# Patient Record
Sex: Male | Born: 1975 | Race: White | Hispanic: No | Marital: Married | Smoking: Never smoker
Health system: Southern US, Community
[De-identification: ages and names within clinical notes are randomized; demographics above are authoritative.]

## PROBLEM LIST (undated history)

## (undated) DIAGNOSIS — Z789 Other specified health status: Secondary | ICD-10-CM

---

## 2013-11-04 ENCOUNTER — Emergency Department (INDEPENDENT_AMBULATORY_CARE_PROVIDER_SITE_OTHER)
Admission: EM | Admit: 2013-11-04 | Discharge: 2013-11-04 | Disposition: A | Discharge status: Home / Self Care | Payer: TRICARE For Life (TFL) | Source: Home / Self Care | Attending: Emergency Medicine | Admitting: Emergency Medicine

## 2013-11-04 ENCOUNTER — Encounter (HOSPITAL_COMMUNITY): Payer: Self-pay | Admitting: Emergency Medicine

## 2013-11-04 DIAGNOSIS — L2089 Other atopic dermatitis: Secondary | ICD-10-CM

## 2013-11-04 DIAGNOSIS — L209 Atopic dermatitis, unspecified: Secondary | ICD-10-CM

## 2013-11-04 MED ORDER — CLOTRIMAZOLE-BETAMETHASONE 1-0.05 % EX CREA: 1.0000 "application " | TOPICAL_CREAM | Freq: Two times a day (BID) | CUTANEOUS | Status: DC

## 2013-11-04 NOTE — ED Provider Notes (Signed)
Medical screening examination/treatment/procedure(s) were performed by non-physician practitioner and as supervising physician I was immediately available for consultation/collaboration.  Leslee Home, M.D.   Reuben Likes, MD 11/04/13 [phone]

## 2013-11-04 NOTE — Discharge Instructions (Signed)
Eczema Eczema, also called atopic dermatitis, is a skin disorder that causes inflammation of the skin. It causes a red rash and dry, scaly skin. The skin becomes very itchy. Eczema is generally worse during the cooler winter months and often improves with the warmth of summer. Eczema usually starts showing signs in infancy. Some children outgrow eczema, but it may last through adulthood.  CAUSES  The exact cause of eczema is not known, but it appears to run in families. People with eczema often have a family history of eczema, allergies, asthma, or hay fever. Eczema is not contagious. Flare-ups of the condition may be caused by:   Contact with something you are sensitive or allergic to.   Stress. SIGNS AND SYMPTOMS  Dry, scaly skin.   Red, itchy rash.   Itchiness. This may occur before the skin rash and may be very intense.  DIAGNOSIS  The diagnosis of eczema is usually made based on symptoms and medical history. TREATMENT  Eczema cannot be cured, but symptoms usually can be controlled with treatment and other strategies. A treatment plan might include:  Controlling the itching and scratching.   Use over-the-counter antihistamines as directed for itching. This is especially useful at night when the itching tends to be worse.   Use over-the-counter steroid creams as directed for itching.   Avoid scratching. Scratching makes the rash and itching worse. It may also result in a skin infection (impetigo) due to a break in the skin caused by scratching.   Keeping the skin well moisturized with creams every day. This will seal in moisture and help prevent dryness. Lotions that contain alcohol and water should be avoided because they can dry the skin.   Limiting exposure to things that you are sensitive or allergic to (allergens).   Recognizing situations that cause stress.   Developing a plan to manage stress.  HOME CARE INSTRUCTIONS   Only take over-the-counter or  prescription medicines as directed by your health care provider.   Do not use anything on the skin without checking with your health care provider.   Keep baths or showers short (5 minutes) in warm (not hot) water. Use mild cleansers for bathing. These should be unscented. You may add nonperfumed bath oil to the bath water. It is best to avoid soap and bubble bath.   Immediately after a bath or shower, when the skin is still damp, apply a moisturizing ointment to the entire body. This ointment should be a petroleum ointment. This will seal in moisture and help prevent dryness. The thicker the ointment, the better. These should be unscented.   Keep fingernails cut short. Children with eczema may need to wear soft gloves or mittens at night after applying an ointment.   Dress in clothes made of cotton or cotton blends. Dress lightly, because heat increases itching.   A child with eczema should stay away from anyone with fever blisters or cold sores. The virus that causes fever blisters (herpes simplex) can cause a serious skin infection in children with eczema. SEEK MEDICAL CARE IF:   Your itching interferes with sleep.   Your rash gets worse or is not better within 1 week after starting treatment.   You see pus or soft yellow scabs in the rash area.   You have a fever.   You have a rash flare-up after contact with someone who has fever blisters.  Document Released: 01/24/2000 Document Revised: 11/16/2012 Document Reviewed: 08/29/2012 Exit[employer] Patient Information 2015 [employer]. This information   is not intended to replace advice given to you by your health care provider. Make sure you discuss any questions you have with your health care provider.  

## 2013-11-04 NOTE — ED Notes (Signed)
C/o  Rash on arms x 2 wks.  No otc treatments tried.   Denies new medication or changes in soaps/detergents.

## 2013-11-04 NOTE — ED Provider Notes (Signed)
CSN: [id number]     Arrival date & time 11/04/13  1724 History   First MD Initiated Contact with Patient 11/04/13 1801     Chief Complaint  Patient presents with  . Rash   (Consider location/radiation/quality/duration/timing/severity/associated sxs/prior Treatment) HPI Comments: Reports himself to be otherwise healthy. 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   Patient is a 38 y.o. male presenting with rash. The history is provided by the patient.  Rash Location:  Shoulder/arm Shoulder/arm rash location:  L upper arm, L forearm, R upper arm and R forearm Quality: dryness, itchiness and redness   Severity:  Moderate Onset quality:  Gradual Duration:  2 weeks Timing:  Constant Progression:  Worsening Chronicity:  New Context: not chemical exposure, not exposure to similar rash, not food, not hot tub use, not insect bite/sting, not medications, not new detergent/soap, not plant contact, not sick contacts and not sun exposure   Ineffective treatments:  Anti-itch cream and topical steroids   History reviewed. No pertinent past medical history. History reviewed. No pertinent past surgical history. History reviewed. No pertinent family history. History  Substance Use Topics  . Smoking status: Never Smoker   . Smokeless tobacco: Not on file  . Alcohol Use: Yes    Review of Systems  Skin: Positive for rash.  All other systems reviewed and are negative.   Allergies  Review of patient's allergies indicates no known allergies.  Home Medications   Prior to Admission medications   Medication Sig Start Date End Date Taking? Authorizing Provider  clotrimazole-betamethasone (LOTRISONE) cream Apply 1 application topically 2 (two) times daily. X 10-14 days 11/04/13   Jess Barters H Deztinee Lohmeyer, PA   BP 145/86  Pulse 65  Temp(Src) 97.9 F (36.6 C) (Oral)  SpO2 100% Physical Exam  Nursing note and vitals  reviewed. Constitutional: He is oriented to person, place, and time. He appears well-developed and well-nourished. No distress.  HENT:  Head: Normocephalic and atraumatic.  Eyes: Conjunctivae are normal.  Cardiovascular: Normal rate.   Pulmonary/Chest: Effort normal.  Musculoskeletal: Normal range of motion.  Neurological: He is alert and oriented to person, place, and time.  Skin: Skin is warm and dry. Rash noted. There is erythema.     Outlined area is with erythematous maculopapular confluent dry rash with fine superficial scale   Psychiatric: He has a normal mood and affect. His behavior is normal.    ED Course  Procedures (including critical care time) Labs Review Labs Reviewed - No data to display  Imaging Review No results found.   MDM   1. Atopic dermatitis    Non-specific atopic dermatitis. Will treat with 10-14 days of Lotrisone and advise follow up if no improvement.    Ria Clock, [place] 11/04/13 1843

## 2014-06-05 ENCOUNTER — Encounter (HOSPITAL_COMMUNITY): Payer: Self-pay | Admitting: *Deleted

## 2014-06-05 ENCOUNTER — Emergency Department (HOSPITAL_COMMUNITY)
Admission: EM | Admit: 2014-06-05 | Discharge: 2014-06-05 | Disposition: A | Discharge status: Home / Self Care | Payer: TRICARE For Life (TFL) | Attending: Emergency Medicine | Admitting: Emergency Medicine

## 2014-06-05 ENCOUNTER — Emergency Department (HOSPITAL_COMMUNITY): Payer: TRICARE For Life (TFL)

## 2014-06-05 DIAGNOSIS — M545 Low back pain: Secondary | ICD-10-CM | POA: Diagnosis present

## 2014-06-05 DIAGNOSIS — M62830 Muscle spasm of back: Secondary | ICD-10-CM | POA: Diagnosis not present

## 2014-06-05 MED ORDER — MELOXICAM 15 MG PO TABS: 15.0000 mg | ORAL_TABLET | Freq: Every day | ORAL | Status: DC

## 2014-06-05 MED ORDER — DIAZEPAM 5 MG PO TABS
5.0000 mg | ORAL_TABLET | Freq: Once | ORAL | Status: AC
  Administered 2014-06-05: 5 mg via ORAL
  Filled 2014-06-05: qty 1

## 2014-06-05 MED ORDER — CYCLOBENZAPRINE HCL 10 MG PO TABS: 10.0000 mg | ORAL_TABLET | Freq: Two times a day (BID) | ORAL | Status: DC | PRN

## 2014-06-05 MED ORDER — HYDROMORPHONE HCL 1 MG/ML IJ SOLN
2.0000 mg | Freq: Once | INTRAMUSCULAR | Status: AC
  Administered 2014-06-05: 2 mg via INTRAMUSCULAR
  Filled 2014-06-05: qty 2

## 2014-06-05 MED ORDER — OXYCODONE-ACETAMINOPHEN 5-325 MG PO TABS: 2.0000 | ORAL_TABLET | ORAL | Status: DC | PRN

## 2014-06-05 MED ORDER — KETOROLAC TROMETHAMINE 60 MG/2ML IM SOLN
60.0000 mg | Freq: Once | INTRAMUSCULAR | Status: AC
  Administered 2014-06-05: 60 mg via INTRAMUSCULAR
  Filled 2014-06-05: qty 2

## 2014-06-05 NOTE — ED Provider Notes (Signed)
CSN: [id number]     Arrival date & time 06/05/14  0709 History   First MD Initiated Contact with Patient 06/05/14 0730     Chief Complaint  Patient presents with  . Back Pain     (Consider location/radiation/quality/duration/timing/severity/associated sxs/prior Treatment) HPI Comments: Patient is a 39 year old male who presents with low back pain that started yesterday after heavy lifting. The pain is aching and severe and does not radiate. The pain is constant. Movement makes the pain worse. Nothing makes the pain better. Patient has not tried anything for pain. No associated symptoms. No saddle paresthesias or bladder/bowel incontinence.      History reviewed. No pertinent past medical history. History reviewed. No pertinent past surgical history. No family history on file. History  Substance Use Topics  . Smoking status: Never Smoker   . Smokeless tobacco: Not on file  . Alcohol Use: Yes    Review of Systems  Musculoskeletal: Positive for back pain.  All other systems reviewed and are negative.     Allergies  Review of patient's allergies indicates no known allergies.  Home Medications   Prior to Admission medications   Medication Sig Start Date End Date Taking? Authorizing Provider  clotrimazole-betamethasone (LOTRISONE) cream Apply 1 application topically 2 (two) times daily. X 10-14 days Patient not taking: Reported on 06/05/2014 11/04/13   Jess BartersJennifer Lee H Presson, PA   BP 126/92 mmHg  Pulse 66  Temp(Src) 97.6 F (36.4 C) (Oral)  Resp 18  Ht 5\' 9"  (1.753 m)  Wt 232 lb (105.235 kg)  BMI 34.24 kg/m2  SpO2 95% Physical Exam  Constitutional: He is oriented to person, place, and time. He appears well-developed and well-nourished. No distress.  HENT:  Head: Normocephalic and atraumatic.  Eyes: Conjunctivae and EOM are normal.  Neck: Normal range of motion.  Cardiovascular: Normal rate and regular rhythm.  Exam reveals no gallop and no friction rub.   No murmur  heard. Pulmonary/Chest: Effort normal and breath sounds normal. He has no wheezes. He has no rales. He exhibits no tenderness.  Abdominal: Soft. He exhibits no distension. There is no tenderness. There is no rebound.  Musculoskeletal: Normal range of motion.  No midline spine tenderness to palpation.   Neurological: He is alert and oriented to person, place, and time. Coordination normal.  Lower extremity strength and sensation equal and intact bilaterally. Speech is goal-oriented. Moves limbs without ataxia.   Skin: Skin is warm and dry.  Psychiatric: He has a normal mood and affect. His behavior is normal.  Nursing note and vitals reviewed.   ED Course  Procedures (including critical care time) Labs Review Labs Reviewed - No data to display  Imaging Review Dg Lumbar Spine Complete  06/05/2014   CLINICAL DATA:  Lifting boxes, twisting injury and left low back pain.  EXAM: LUMBAR SPINE - COMPLETE 4+ VIEW  COMPARISON:  None.  FINDINGS: There is no evidence of lumbar spine fracture. Alignment is normal. Intervertebral disc spaces are maintained.  IMPRESSION: Negative.   Electronically Signed   By: Charlett NoseKevin  Dover M.D.   On: 06/05/2014 09:20     EKG Interpretation None      MDM   Final diagnoses:  Spasm of lumbar paraspinous muscle    8:48 AM Patient given toradol and valium for symptoms which provided little relief. No bladder/bowel incontinence or saddle paresthesias. Patient will have IM dilaudid and lumbar spine xray.   10:16 AM Patient feeling better after dilaudid. Patient likely has muscle  spasm and will be discharged with percocet, mobic and flexeril. Vitals stable and patient afebrile.   Emilia Beck, PA-C 06/07/14 0600  Samuel Jester, DO 06/07/14 1454

## 2014-06-05 NOTE — ED Notes (Signed)
Patient wife came to desk to ask for something else for pain.  Per wife, patient pain has gotten worse and now going into legs.   Advised I would have PA come in to see patient.

## 2014-06-05 NOTE — Discharge Instructions (Signed)
Take Percocet as needed for pain. Take mobic for inflammation. Take Flexeril as needed for muscle spasm. You may take these medications together. Apply heat to the affected area. Refer to attached documents for more information.

## 2014-06-05 NOTE — ED Notes (Signed)
Pt states that he was lifting boxes when he twisted and began having pain in his left side. Pt states that he has pain with lifting and standing for lon periods. Pt denies numbness tingling to leg. No urinary symptoms reported.

## 2019-02-21 ENCOUNTER — Ambulatory Visit: Attending: Internal Medicine

## 2019-02-21 DIAGNOSIS — Z20822 Contact with and (suspected) exposure to covid-19: Secondary | ICD-10-CM

## 2019-02-23 LAB — NOVEL CORONAVIRUS, NAA: SARS-CoV-2, NAA: NOT DETECTED

## 2020-09-15 ENCOUNTER — Emergency Department

## 2020-09-15 ENCOUNTER — Observation Stay: Admitting: Anesthesiology

## 2020-09-15 ENCOUNTER — Observation Stay
Admission: EM | Admit: 2020-09-15 | Discharge: 2020-09-16 | Disposition: A | Discharge status: Home / Self Care | Attending: General Surgery | Admitting: General Surgery

## 2020-09-15 ENCOUNTER — Other Ambulatory Visit: Payer: Self-pay

## 2020-09-15 ENCOUNTER — Encounter
Admission: EM | Disposition: A | Discharge status: Home / Self Care | Payer: Self-pay | Source: Home / Self Care | Attending: Emergency Medicine

## 2020-09-15 DIAGNOSIS — R1011 Right upper quadrant pain: Secondary | ICD-10-CM | POA: Diagnosis present

## 2020-09-15 DIAGNOSIS — Z20822 Contact with and (suspected) exposure to covid-19: Secondary | ICD-10-CM | POA: Insufficient documentation

## 2020-09-15 DIAGNOSIS — K810 Acute cholecystitis: Secondary | ICD-10-CM | POA: Diagnosis present

## 2020-09-15 DIAGNOSIS — K8020 Calculus of gallbladder without cholecystitis without obstruction: Secondary | ICD-10-CM

## 2020-09-15 DIAGNOSIS — R109 Unspecified abdominal pain: Secondary | ICD-10-CM

## 2020-09-15 DIAGNOSIS — K8012 Calculus of gallbladder with acute and chronic cholecystitis without obstruction: Principal | ICD-10-CM | POA: Insufficient documentation

## 2020-09-15 DIAGNOSIS — K8050 Calculus of bile duct without cholangitis or cholecystitis without obstruction: Secondary | ICD-10-CM

## 2020-09-15 HISTORY — DX: Other specified health status: Z78.9

## 2020-09-15 LAB — CBC
HCT: 44.9 % (ref 39.0–52.0)
Hemoglobin: 16.1 g/dL (ref 13.0–17.0)
MCH: 30.6 pg (ref 26.0–34.0)
MCHC: 35.9 g/dL (ref 30.0–36.0)
MCV: 85.2 fL (ref 80.0–100.0)
Platelets: 269 10*3/uL (ref 150–400)
RBC: 5.27 MIL/uL (ref 4.22–5.81)
RDW: 12.9 % (ref 11.5–15.5)
WBC: 13.9 10*3/uL — ABNORMAL HIGH (ref 4.0–10.5)
nRBC: 0 % (ref 0.0–0.2)

## 2020-09-15 LAB — LIPASE, BLOOD: Lipase: 40 U/L (ref 11–51)

## 2020-09-15 LAB — BASIC METABOLIC PANEL
Anion gap: 9 (ref 5–15)
BUN: 12 mg/dL (ref 6–20)
CO2: 26 mmol/L (ref 22–32)
Calcium: 9.8 mg/dL (ref 8.9–10.3)
Chloride: 101 mmol/L (ref 98–111)
Creatinine, Ser: 1.06 mg/dL (ref 0.61–1.24)
GFR, Estimated: >60 mL/min (ref >60)
Glucose, Bld: 142 mg/dL — ABNORMAL HIGH (ref 70–99)
Potassium: 3.9 mmol/L (ref 3.5–5.1)
Sodium: 136 mmol/L (ref 135–145)

## 2020-09-15 LAB — RESP PANEL BY RT-PCR (FLU A&B, COVID) ARPGX2
Influenza A by PCR: NEGATIVE
Influenza B by PCR: NEGATIVE
SARS Coronavirus 2 by RT PCR: NEGATIVE

## 2020-09-15 LAB — TROPONIN I (HIGH SENSITIVITY)
Troponin I (High Sensitivity): 5 ng/L (ref <18)
Troponin I (High Sensitivity): 5 ng/L (ref <18)

## 2020-09-15 LAB — HEPATIC FUNCTION PANEL
ALT: 47 U/L — ABNORMAL HIGH (ref 0–44)
AST: 44 U/L — ABNORMAL HIGH (ref 15–41)
Albumin: 4.8 g/dL (ref 3.5–5.0)
Alkaline Phosphatase: 86 U/L (ref 38–126)
Bilirubin, Direct: <0.1 mg/dL (ref 0.0–0.2)
Total Bilirubin: 0.6 mg/dL (ref 0.3–1.2)
Total Protein: 7.7 g/dL (ref 6.5–8.1)

## 2020-09-15 SURGERY — CHOLECYSTECTOMY, ROBOT-ASSISTED, LAPAROSCOPIC
Anesthesia: General

## 2020-09-15 MED ORDER — MORPHINE SULFATE (PF) 4 MG/ML IV SOLN
4.0000 mg | Freq: Once | INTRAVENOUS | Status: AC
  Administered 2020-09-15: 4 mg via INTRAVENOUS
  Filled 2020-09-15: qty 1

## 2020-09-15 MED ORDER — PROPOFOL 10 MG/ML IV BOLUS
INTRAVENOUS | Status: AC
  Filled 2020-09-15: qty 20

## 2020-09-15 MED ORDER — ACETAMINOPHEN 650 MG RE SUPP: 650.0000 mg | Freq: Four times a day (QID) | RECTAL | Status: DC | PRN

## 2020-09-15 MED ORDER — "VISTASEAL 4 ML SINGLE DOSE KIT "
PACK | CUTANEOUS | Status: DC | PRN
  Administered 2020-09-15: 4 mL via TOPICAL

## 2020-09-15 MED ORDER — FENTANYL CITRATE (PF) 100 MCG/2ML IJ SOLN
INTRAMUSCULAR | Status: AC
  Filled 2020-09-15: qty 2

## 2020-09-15 MED ORDER — CEFAZOLIN (ANCEF) 1 G IV SOLR: 1.0000 g | INTRAVENOUS | Status: DC

## 2020-09-15 MED ORDER — 0.9 % SODIUM CHLORIDE (POUR BTL) OPTIME
TOPICAL | Status: DC | PRN
  Administered 2020-09-15: 500 mL

## 2020-09-15 MED ORDER — ENOXAPARIN SODIUM 60 MG/0.6ML IJ SOSY
0.5000 mg/kg | PREFILLED_SYRINGE | INTRAMUSCULAR | Status: DC
  Administered 2020-09-16: 50 mg via SUBCUTANEOUS
  Filled 2020-09-15: qty 0.6

## 2020-09-15 MED ORDER — ONDANSETRON HCL 4 MG/2ML IJ SOLN
INTRAMUSCULAR | Status: DC | PRN
  Administered 2020-09-15: 4 mg via INTRAVENOUS

## 2020-09-15 MED ORDER — HYDROMORPHONE HCL 1 MG/ML IJ SOLN
0.2500 mg | INTRAMUSCULAR | Status: DC | PRN
  Administered 2020-09-15: 0.5 mg via INTRAVENOUS

## 2020-09-15 MED ORDER — HYDROMORPHONE HCL 1 MG/ML IJ SOLN
INTRAMUSCULAR | Status: AC
  Filled 2020-09-15: qty 1

## 2020-09-15 MED ORDER — SUCCINYLCHOLINE CHLORIDE 200 MG/10ML IV SOSY
PREFILLED_SYRINGE | INTRAVENOUS | Status: DC | PRN
  Administered 2020-09-15: 140 mg via INTRAVENOUS

## 2020-09-15 MED ORDER — PANTOPRAZOLE SODIUM 40 MG IV SOLR
40.0000 mg | Freq: Every day | INTRAVENOUS | Status: DC
  Filled 2020-09-15: qty 40

## 2020-09-15 MED ORDER — EPHEDRINE SULFATE 50 MG/ML IJ SOLN
INTRAMUSCULAR | Status: DC | PRN
  Administered 2020-09-15: 15 mg via INTRAVENOUS

## 2020-09-15 MED ORDER — MIDAZOLAM HCL 2 MG/2ML IJ SOLN
INTRAMUSCULAR | Status: AC
  Filled 2020-09-15: qty 2

## 2020-09-15 MED ORDER — FENTANYL CITRATE (PF) 100 MCG/2ML IJ SOLN
INTRAMUSCULAR | Status: DC | PRN
  Administered 2020-09-15: 100 ug via INTRAVENOUS

## 2020-09-15 MED ORDER — CEFAZOLIN SODIUM 1 G IJ SOLR
INTRAMUSCULAR | Status: AC
  Filled 2020-09-15: qty 20

## 2020-09-15 MED ORDER — ENOXAPARIN SODIUM 60 MG/0.6ML IJ SOSY: 0.5000 mg/kg | PREFILLED_SYRINGE | INTRAMUSCULAR | Status: DC

## 2020-09-15 MED ORDER — SUGAMMADEX SODIUM 200 MG/2ML IV SOLN
INTRAVENOUS | Status: DC | PRN
  Administered 2020-09-15: 400 mg via INTRAVENOUS

## 2020-09-15 MED ORDER — ACETAMINOPHEN 10 MG/ML IV SOLN
INTRAVENOUS | Status: AC
  Filled 2020-09-15: qty 100

## 2020-09-15 MED ORDER — SODIUM CHLORIDE 0.9 % IV SOLN
INTRAVENOUS | Status: DC | PRN
  Administered 2020-09-15: 250 mL via INTRAVENOUS

## 2020-09-15 MED ORDER — ALUM & MAG HYDROXIDE-SIMETH 200-200-20 MG/5ML PO SUSP
15.0000 mL | Freq: Once | ORAL | Status: AC
  Administered 2020-09-15: 15 mL via ORAL
  Filled 2020-09-15: qty 30

## 2020-09-15 MED ORDER — ONDANSETRON HCL 4 MG/2ML IJ SOLN: 4.0000 mg | Freq: Four times a day (QID) | INTRAMUSCULAR | Status: DC | PRN

## 2020-09-15 MED ORDER — DEXAMETHASONE SODIUM PHOSPHATE 10 MG/ML IJ SOLN
INTRAMUSCULAR | Status: AC
  Filled 2020-09-15: qty 1

## 2020-09-15 MED ORDER — SODIUM CHLORIDE 0.9 % IV SOLN: INTRAVENOUS | Status: DC

## 2020-09-15 MED ORDER — DEXMEDETOMIDINE (PRECEDEX) IN NS 20 MCG/5ML (4 MCG/ML) IV SYRINGE
PREFILLED_SYRINGE | INTRAVENOUS | Status: DC | PRN
  Administered 2020-09-15: 8 ug via INTRAVENOUS
  Administered 2020-09-15: 4 ug via INTRAVENOUS
  Administered 2020-09-15: 8 ug via INTRAVENOUS

## 2020-09-15 MED ORDER — PIPERACILLIN-TAZOBACTAM 3.375 G IVPB
3.3750 g | Freq: Three times a day (TID) | INTRAVENOUS | Status: DC
  Administered 2020-09-16 (×2): 3.375 g via INTRAVENOUS
  Filled 2020-09-15 (×2): qty 50

## 2020-09-15 MED ORDER — IOHEXOL 350 MG/ML SOLN
75.0000 mL | Freq: Once | INTRAVENOUS | Status: AC | PRN
  Administered 2020-09-15: 75 mL via INTRAVENOUS
  Filled 2020-09-15: qty 75

## 2020-09-15 MED ORDER — BUPIVACAINE-EPINEPHRINE 0.25% -1:200000 IJ SOLN
INTRAMUSCULAR | Status: DC | PRN
  Administered 2020-09-15: 30 mL

## 2020-09-15 MED ORDER — KETOROLAC TROMETHAMINE 30 MG/ML IJ SOLN
INTRAMUSCULAR | Status: AC
  Administered 2020-09-15: 30 mg via INTRAVENOUS
  Filled 2020-09-15: qty 1

## 2020-09-15 MED ORDER — DEXAMETHASONE SODIUM PHOSPHATE 10 MG/ML IJ SOLN
INTRAMUSCULAR | Status: DC | PRN
  Administered 2020-09-15: 10 mg via INTRAVENOUS

## 2020-09-15 MED ORDER — CEFAZOLIN SODIUM-DEXTROSE 1-4 GM/50ML-% IV SOLN
1.0000 g | Freq: Once | INTRAVENOUS | Status: AC
  Administered 2020-09-15: 2 g via INTRAVENOUS
  Filled 2020-09-15: qty 50

## 2020-09-15 MED ORDER — ONDANSETRON 4 MG PO TBDP: 4.0000 mg | ORAL_TABLET | Freq: Four times a day (QID) | ORAL | Status: DC | PRN

## 2020-09-15 MED ORDER — ROCURONIUM BROMIDE 100 MG/10ML IV SOLN
INTRAVENOUS | Status: DC | PRN
  Administered 2020-09-15: 50 mg via INTRAVENOUS
  Administered 2020-09-15: 20 mg via INTRAVENOUS

## 2020-09-15 MED ORDER — INDOCYANINE GREEN 25 MG IV SOLR
1.2500 mg | Freq: Once | INTRAVENOUS | Status: AC
  Administered 2020-09-15: 1.25 mg via INTRAVENOUS
  Filled 2020-09-15: qty 0.5

## 2020-09-15 MED ORDER — SODIUM CHLORIDE 0.9 % IV BOLUS
1000.0000 mL | Freq: Once | INTRAVENOUS | Status: AC
  Administered 2020-09-15: 1000 mL via INTRAVENOUS

## 2020-09-15 MED ORDER — HYDROCODONE-ACETAMINOPHEN 5-325 MG PO TABS: 1.0000 | ORAL_TABLET | ORAL | Status: DC | PRN

## 2020-09-15 MED ORDER — LACTATED RINGERS IV SOLN: INTRAVENOUS | Status: DC | PRN

## 2020-09-15 MED ORDER — KETAMINE HCL 50 MG/ML IJ SOLN
INTRAMUSCULAR | Status: AC
  Filled 2020-09-15: qty 1

## 2020-09-15 MED ORDER — ONDANSETRON HCL 4 MG/2ML IJ SOLN
INTRAMUSCULAR | Status: AC
  Filled 2020-09-15: qty 2

## 2020-09-15 MED ORDER — FAMOTIDINE 20 MG PO TABS
20.0000 mg | ORAL_TABLET | Freq: Once | ORAL | Status: AC
  Administered 2020-09-15: 20 mg via ORAL
  Filled 2020-09-15: qty 1

## 2020-09-15 MED ORDER — PANTOPRAZOLE SODIUM 40 MG IV SOLR
40.0000 mg | Freq: Every day | INTRAVENOUS | Status: DC
  Administered 2020-09-16: 40 mg via INTRAVENOUS
  Filled 2020-09-15: qty 40

## 2020-09-15 MED ORDER — PROPOFOL 10 MG/ML IV BOLUS
INTRAVENOUS | Status: DC | PRN
  Administered 2020-09-15: 150 mg via INTRAVENOUS
  Administered 2020-09-15: 50 mg via INTRAVENOUS

## 2020-09-15 MED ORDER — ACETAMINOPHEN 325 MG PO TABS: 650.0000 mg | ORAL_TABLET | Freq: Four times a day (QID) | ORAL | Status: DC | PRN

## 2020-09-15 MED ORDER — MIDAZOLAM HCL 2 MG/2ML IJ SOLN
INTRAMUSCULAR | Status: DC | PRN
  Administered 2020-09-15: 2 mg via INTRAVENOUS

## 2020-09-15 MED ORDER — KETAMINE HCL 50 MG/ML IJ SOLN
INTRAMUSCULAR | Status: DC | PRN
  Administered 2020-09-15 (×2): 25 mg via INTRAMUSCULAR

## 2020-09-15 MED ORDER — ACETAMINOPHEN 10 MG/ML IV SOLN
INTRAVENOUS | Status: DC | PRN
  Administered 2020-09-15: 1000 mg via INTRAVENOUS

## 2020-09-15 MED ORDER — ONDANSETRON HCL 4 MG/2ML IJ SOLN: 4.0000 mg | Freq: Once | INTRAMUSCULAR | Status: DC | PRN

## 2020-09-15 MED ORDER — ONDANSETRON HCL 4 MG/2ML IJ SOLN
4.0000 mg | INTRAMUSCULAR | Status: AC
  Administered 2020-09-15: 4 mg via INTRAVENOUS
  Filled 2020-09-15: qty 2

## 2020-09-15 MED ORDER — MORPHINE SULFATE (PF) 4 MG/ML IV SOLN: 4.0000 mg | INTRAVENOUS | Status: DC | PRN

## 2020-09-15 MED ORDER — IOHEXOL 9 MG/ML PO SOLN
500.0000 mL | Freq: Once | ORAL | Status: DC | PRN
  Filled 2020-09-15: qty 500

## 2020-09-15 MED ORDER — KETOROLAC TROMETHAMINE 30 MG/ML IJ SOLN: 30.0000 mg | Freq: Once | INTRAMUSCULAR | Status: AC | PRN

## 2020-09-15 MED ORDER — LIDOCAINE HCL (CARDIAC) PF 100 MG/5ML IV SOSY
PREFILLED_SYRINGE | INTRAVENOUS | Status: DC | PRN
Start: 2020-09-15 — End: 2020-09-15
  Administered 2020-09-15: 100 mg via INTRAVENOUS

## 2020-09-15 MED ORDER — SODIUM CHLORIDE FLUSH 0.9 % IV SOLN
INTRAVENOUS | Status: AC
  Filled 2020-09-15: qty 10

## 2020-09-15 SURGICAL SUPPLY — 52 items
APPLICATOR VISTASEAL 35 (MISCELLANEOUS) ×3 IMPLANT
BAG INFUSER PRESSURE 100CC (MISCELLANEOUS) IMPLANT
BLADE SURG SZ11 CARB STEEL (BLADE) ×3 IMPLANT
CANISTER SUCT 1200ML W/VALVE (MISCELLANEOUS) ×3 IMPLANT
CANNULA REDUC XI 12-8 STAPL (CANNULA) ×1
CANNULA REDUCER 12-8 DVNC XI (CANNULA) ×2 IMPLANT
CHLORAPREP W/TINT 26 (MISCELLANEOUS) ×3 IMPLANT
CLIP VESOLOCK MED LG 6/CT (CLIP) ×3 IMPLANT
DECANTER SPIKE VIAL GLASS SM (MISCELLANEOUS) IMPLANT
DEFOGGER SCOPE WARMER CLEARIFY (MISCELLANEOUS) ×3 IMPLANT
DERMABOND ADVANCED (GAUZE/BANDAGES/DRESSINGS) ×1
DERMABOND ADVANCED .7 DNX12 (GAUZE/BANDAGES/DRESSINGS) ×2 IMPLANT
DRAPE ARM DVNC X/XI (DISPOSABLE) ×8 IMPLANT
DRAPE COLUMN DVNC XI (DISPOSABLE) ×2 IMPLANT
DRAPE DA VINCI XI ARM (DISPOSABLE) ×4
DRAPE DA VINCI XI COLUMN (DISPOSABLE) ×1
ELECT REM PT RETURN 9FT ADLT (ELECTROSURGICAL) ×3
ELECTRODE REM PT RTRN 9FT ADLT (ELECTROSURGICAL) ×2 IMPLANT
GAUZE 4X4 16PLY ~~LOC~~+RFID DBL (SPONGE) ×3 IMPLANT
GLOVE SURG ENC MOIS LTX SZ6.5 (GLOVE) ×12 IMPLANT
GLOVE SURG UNDER POLY LF SZ6.5 (GLOVE) ×18 IMPLANT
GOWN STRL REUS W/ TWL LRG LVL3 (GOWN DISPOSABLE) ×8 IMPLANT
GOWN STRL REUS W/TWL LRG LVL3 (GOWN DISPOSABLE) ×4
GRASPER SUT TROCAR 14GX15 (MISCELLANEOUS) ×3 IMPLANT
IRRIGATOR SUCT 8 DISP DVNC XI (IRRIGATION / IRRIGATOR) IMPLANT
IRRIGATOR SUCTION 8MM XI DISP (IRRIGATION / IRRIGATOR)
IV NS 1000ML (IV SOLUTION)
IV NS 1000ML BAXH (IV SOLUTION) IMPLANT
KIT PINK PAD W/HEAD ARE REST (MISCELLANEOUS) ×3 IMPLANT
KIT PINK PAD W/HEAD ARM REST (MISCELLANEOUS) ×2 IMPLANT
LABEL OR SOLS (LABEL) ×3 IMPLANT
MANIFOLD NEPTUNE II (INSTRUMENTS) ×3 IMPLANT
NEEDLE HYPO 22GX1.5 SAFETY (NEEDLE) ×3 IMPLANT
NEEDLE INSUFFLATION 14GA 120MM (NEEDLE) ×3 IMPLANT
NS IRRIG 500ML POUR BTL (IV SOLUTION) ×3 IMPLANT
OBTURATOR OPTICAL STANDARD 8MM (TROCAR) ×1
OBTURATOR OPTICAL STND 8 DVNC (TROCAR) ×2
OBTURATOR OPTICALSTD 8 DVNC (TROCAR) ×2 IMPLANT
PACK LAP CHOLECYSTECTOMY (MISCELLANEOUS) ×3 IMPLANT
POUCH SPECIMEN RETRIEVAL 10MM (ENDOMECHANICALS) ×3 IMPLANT
SEAL CANN UNIV 5-8 DVNC XI (MISCELLANEOUS) ×6 IMPLANT
SEAL XI 5MM-8MM UNIVERSAL (MISCELLANEOUS) ×3
SET TUBE SMOKE EVAC HIGH FLOW (TUBING) ×3 IMPLANT
SOLUTION ELECTROLUBE (MISCELLANEOUS) ×3 IMPLANT
SPONGE T-LAP 4X18 ~~LOC~~+RFID (SPONGE) ×3 IMPLANT
STAPLER CANNULA SEAL DVNC XI (STAPLE) ×2 IMPLANT
STAPLER CANNULA SEAL XI (STAPLE) ×1
SUT MNCRL 4-0 (SUTURE) ×1
SUT MNCRL 4-0 27XMFL (SUTURE) ×2
SUT VICRYL 0 AB UR-6 (SUTURE) ×3 IMPLANT
SUTURE MNCRL 4-0 27XMF (SUTURE) ×2 IMPLANT
TROCAR XCEL NON-BLD 5MMX100MML (ENDOMECHANICALS) ×3 IMPLANT

## 2020-09-15 NOTE — ED Notes (Signed)
Pt changed into gown and belongings placed in hospital bag.

## 2020-09-15 NOTE — Anesthesia Procedure Notes (Signed)
Procedure Name: Intubation Date/Time: 09/15/2020 5:54 PM Performed by: Clinton Sawyer, CRNA Pre-anesthesia Checklist: Patient identified, Patient being monitored, Timeout performed, Emergency Drugs available and Suction available Patient Re-evaluated:Patient Re-evaluated prior to induction Oxygen Delivery Method: Circle system utilized Preoxygenation: Pre-oxygenation with 100% oxygen Induction Type: IV induction Laryngoscope Size: Mac and 4 Grade View: Grade I Tube type: Oral Tube size: 7.5 mm Number of attempts: 1 Airway Equipment and Method: Stylet Placement Confirmation: ETT inserted through vocal cords under direct vision, positive ETCO2 and breath sounds checked- equal and bilateral Secured at: 22 cm Tube secured with: Tape Dental Injury: Teeth and Oropharynx as per pre-operative assessment

## 2020-09-15 NOTE — ED Triage Notes (Signed)
Pt comes pov with upper abd pain that radiates to the back. Pt States it started last night. Dry heaving and SOB. Denies heart hx.

## 2020-09-15 NOTE — H&P (Signed)
SURGICAL HISTORY AND PHYSICAL NOTE   HISTORY OF PRESENT ILLNESS (HPI):  45 y.o. male presented to [facility] ED for evaluation of abdominal pain since this morning. Patient reports pain woke him up around midnight.  Pain has been intensifying.  Reports associated nausea and vomiting.  Denies any fevers.  Pain localized to the right upper quadrant.  Pain does radiate to the right back.  There has been no aggravating factor identified.  No alleviating factors despite multiple doses of morphine.  At the ED he was found with leukocytosis.  CT scan of the abdomen shows a large calcified stone in the gallbladder is around 3 cm systolic to the left lower neck.  Ultrasound shows same findings.  I personally evaluated the images of the ultrasound and the CT scan.  Surgery is consulted by Dr. Fanny Bien in this context for evaluation and management of cholecystitis.  PAST MEDICAL HISTORY (PMH):  History reviewed. No pertinent past medical history.   PAST SURGICAL HISTORY (PSH):  History reviewed. No pertinent surgical history.   MEDICATIONS:  Prior to Admission medications   Medication Sig Start Date End Date Taking? Authorizing Provider  clotrimazole-betamethasone (LOTRISONE) cream Apply 1 application topically 2 (two) times daily. X 10-14 days Patient not taking: No sig reported 11/04/13   Presson, Mathis Fare, PA     ALLERGIES:  No Known Allergies   SOCIAL HISTORY:  Social History   Socioeconomic History   Marital status: Married    Spouse name: Not on file   Number of children: Not on file   Years of education: Not on file   Highest education level: Not on file  Occupational History   Not on file  Tobacco Use   Smoking status: Never   Smokeless tobacco: Not on file  Substance and Sexual Activity   Alcohol use: Yes   Drug use: No   Sexual activity: Yes  Other Topics Concern   Not on file  Social History Narrative   Not on file   Social Determinants of Health   Financial Resource  Strain: Not on file  Food Insecurity: Not on file  Transportation Needs: Not on file  Physical Activity: Not on file  Stress: Not on file  Social Connections: Not on file  Intimate Partner Violence: Not on file     FAMILY HISTORY:  History reviewed. No pertinent family history.   REVIEW OF SYSTEMS:  Constitutional: denies weight loss, fever, chills, or sweats  Eyes: denies any other vision changes, history of eye injury  ENT: denies sore throat, hearing problems  Respiratory: denies shortness of breath, wheezing  Cardiovascular: denies chest pain, palpitations  Gastrointestinal: positive abdominal pain, nausea and vomitnig Genitourinary: denies burning with urination or urinary frequency Musculoskeletal: denies any other joint pains or cramps  Skin: denies any other rashes or skin discolorations  Neurological: denies any other headache, dizziness, weakness  Psychiatric: denies any other depression, anxiety   All other review of systems were negative   VITAL SIGNS:  Temp:  [98.1 F (36.7 C)-98.3 F (36.8 C)] 98.1 F (36.7 C) (08/07 1604) Pulse Rate:  [67-78] 78 (08/07 1604) Resp:  [16-19] 16 (08/07 1604) BP: (149-152)/(93-101) 149/93 (08/07 1604) SpO2:  [97 %-98 %] 97 % (08/07 1604) Weight:  [98 kg] 98 kg (08/07 1038)     Height: 5\' 9"  (175.3 cm) Weight: 98 kg BMI (Calculated): 31.88   INTAKE/OUTPUT:  This shift: No intake/output data recorded.  Last 2 shifts: @IOLAST2SHIFTS @   PHYSICAL EXAM:  Constitutional:  --  Normal body habitus  -- Awake, alert, and oriented x3  Eyes:  -- Pupils equally round and reactive to light  -- No scleral icterus  Ear, nose, and throat:  -- No jugular venous distension  Pulmonary:  -- No crackles  -- Equal breath sounds bilaterally -- Breathing non-labored at rest Cardiovascular:  -- S1, S2 present  -- No pericardial rubs Gastrointestinal:  -- Abdomen soft, tender in the right upper quadrant, non-distended, no guarding or rebound  tenderness -- No abdominal masses appreciated, pulsatile or otherwise  Musculoskeletal and Integumentary:  -- Wounds: None appreciated -- Extremities: B/L UE and LE FROM, hands and feet warm, no edema  Neurologic:  -- Motor function: intact and symmetric -- Sensation: intact and symmetric   Labs:  CBC Latest Ref Rng & Units 09/15/2020  WBC 4.0 - 10.5 K/uL 13.9(H)  Hemoglobin 13.0 - 17.0 g/dL 32.2  Hematocrit 02.5 - 52.0 % 44.9  Platelets 150 - 400 K/uL 269   CMP Latest Ref Rng & Units 09/15/2020  Glucose 70 - 99 mg/dL 427(C)  BUN 6 - 20 mg/dL 12  Creatinine 6.23 - 7.62 mg/dL 8.31  Sodium 517 - 616 mmol/L 136  Potassium 3.5 - 5.1 mmol/L 3.9  Chloride 98 - 111 mmol/L 101  CO2 22 - 32 mmol/L 26  Calcium 8.9 - 10.3 mg/dL 9.8  Total Protein 6.5 - 8.1 g/dL 7.7  Total Bilirubin 0.3 - 1.2 mg/dL 0.6  Alkaline Phos 38 - 126 U/L 86  AST 15 - 41 U/L 44(H)  ALT 0 - 44 U/L 47(H)     Imaging studies:  EXAM: ULTRASOUND ABDOMEN LIMITED RIGHT UPPER QUADRANT   COMPARISON:  CT of the abdomen and pelvis 09/15/2020   FINDINGS: Gallbladder:   Gallbladder wall is normal in thickness, 2.4 millimeters. A 2.3 centimeter stone is identified in the region of the gallbladder neck. No sonographic Murphy sign. There is a small amount of sludge layering in the gallbladder.   Common bile duct:   Diameter: 5.0 millimeters   Liver:   Mildly heterogeneous echotexture of the liver. There are focal areas of hyperechoic parenchyma, consistent with areas of focal fatty infiltration. No suspicious liver lesion identified. Portal vein is patent on color Doppler imaging with normal direction of blood flow towards the liver.   Other: None.   IMPRESSION: 1. Calcified gallstone within the gallbladder neck. 2. No other evidence for acute cholecystitis. 3. Focal areas of fatty infiltration in the liver.     Electronically Signed   By: Norva Pavlov M.D.   On: 09/15/2020 15:28  Assessment/Plan:   45 y.o. male with acute cholecystitis.  Patient with history, physical exam and images consistent with acute cholecystitis.  The fact that he has a large 3 cm stone stuck in the gallbladder neck and the fact that the pain has not improved with morphine is consistent with cholecystitis despite not having other findings such as gallbladder wall thickening or pericholecystic fluid.  Patient also with leukocytosis.  Patient oriented about diagnosis and surgical management as treatment.   Discussed the risk of surgery including post-op infxn, seroma, biloma, chronic pain, poor-delayed wound healing, retained gallstone, conversion to open procedure, post-op SBO or ileus, and need for additional procedures to address said risks.  The risks of general anesthetic including MI, CVA, sudden death or even reaction to anesthetic medications also discussed. Alternatives include continued observation.  Benefits include possible symptom relief, prevention of complications including acute cholecystitis, pancreatitis.  Gae Gallop, MD

## 2020-09-15 NOTE — ED Notes (Signed)
See triage note  Presents with upper abd pain which radiates into back  Positive n/v

## 2020-09-15 NOTE — Consult Note (Signed)
PHARMACIST - PHYSICIAN COMMUNICATION  CONCERNING:  Enoxaparin (Lovenox) for DVT Prophylaxis    RECOMMENDATION: Patient was prescribed enoxaprin 40mg  q24 hours for VTE prophylaxis. But given BMI>30, patient will be transitioned to enoxaparin 0.5mg /kg every 24 hours.   Filed Weights   09/15/20 1038  Weight: 98 kg (216 lb)    Body mass index is 31.9 kg/m.  Estimated Creatinine Clearance: 100.5 mL/min (by C-G formula based on SCr of 1.06 mg/dL).   Based on [facility] policy patient is candidate for enoxaparin 0.5mg /kg TBW SQ every 24 hours based on BMI being >30.   DESCRIPTION: Pharmacy has adjusted enoxaparin dose per [facility] policy.  Patient is now receiving enoxaparin 50 mg every 24 hours    [facility], PharmD Pharmacy Resident  09/15/2020 4:40 PM

## 2020-09-15 NOTE — Transfer of Care (Signed)
Immediate Anesthesia Transfer of Care Note  Patient: John Galloway  Procedure(s) Performed: XI ROBOTIC ASSISTED LAPAROSCOPIC CHOLECYSTECTOMY INDOCYANINE GREEN FLUORESCENCE IMAGING (ICG)  Patient Location: PACU  Anesthesia Type:General  Level of Consciousness: sedated  Airway & Oxygen Therapy: Patient Spontanous Breathing and Patient connected to face mask oxygen OAW in place  Rn aware  Post-op Assessment: Report given to RN and Post -op Vital signs reviewed and stable  Post vital signs: Reviewed and stable  Last Vitals:  Vitals Value Taken Time  BP 133/72 09/15/20 2055  Temp    Pulse 79 09/15/20 2057  Resp 13 09/15/20 2057  SpO2 98 % 09/15/20 2057  Vitals shown include unvalidated device data.  Last Pain:  Vitals:   09/15/20 1604  TempSrc: Oral  PainSc:          Complications: No notable events documented.

## 2020-09-15 NOTE — ED Provider Notes (Signed)
[facility] Emergency Department Provider Note ____________________________________________   Event Date/Time   First MD Initiated Contact with Patient 09/15/20 1226     (approximate)  I have reviewed the triage vital signs and the nursing notes.  HISTORY  Chief Complaint Abdominal Pain  HPI John Galloway is a 45 y.o. male orts no past medical history currently takes no medications no allergies to anything  At midnight he was up in his house, and started noticed discomfort in the middle of his upper abdomen a little bit more on the left side.  Felt like a bit of indigestion at first, but now reports its a fairly constant pain in the middle of his upper abdomen does radiate slightly toward his back.  No shortness of breath or chest pain  His wife was recently diagnosed and is currently still on antibiotic treatment for E. coli enteritis.  He however has not had any diarrhea or fevers.  He has had dry heaving but no diarrhea.  Denies vomiting.  Feels still slightly nauseated with moderate pain in the same area and has not moved  Continues to pass gas had a normal bowel meant yesterday  History reviewed. No pertinent past medical history.  Patient Active Problem List   Diagnosis Date Noted   Acute cholecystitis 09/15/2020    History reviewed. No pertinent surgical history.  Prior to Admission medications   Medication Sig Start Date End Date Taking? Authorizing Provider  clotrimazole-betamethasone (LOTRISONE) cream Apply 1 application topically 2 (two) times daily. X 10-14 days Patient not taking: No sig reported 11/04/13   Presson, Mathis Fare, PA    Allergies Patient has no known allergies.  History reviewed. No pertinent family history.  Social History Social History   Tobacco Use   Smoking status: Never  Substance Use Topics   Alcohol use: Yes   Drug use: No    Review of Systems Constitutional: No fever/chills Eyes: No visual  changes. ENT: No sore throat. Cardiovascular: Denies chest pain. Respiratory: Denies shortness of breath. Gastrointestinal: See HPI Genitourinary: Negative for dysuria. Musculoskeletal: Negative for back pain some slight radiation. Skin: Negative for rash. Neurological: Negative for headaches, areas of focal weakness or numbness.    ____________________________________________   PHYSICAL EXAM:  VITAL SIGNS: ED Triage Vitals  Enc Vitals Group     BP 09/15/20 1037 (!) 152/101     Pulse Rate 09/15/20 1037 76     Resp 09/15/20 1037 18     Temp 09/15/20 1037 98.3 F (36.8 C)     Temp Source 09/15/20 1037 Oral     SpO2 09/15/20 1037 98 %     Weight 09/15/20 1038 216 lb (98 kg)     Height 09/15/20 1038 5\' 9"  (1.753 m)     Head Circumference --      Peak Flow --      Pain Score 09/15/20 1038 8     Pain Loc --      Pain Edu? --      Excl. in GC? --     Constitutional: Alert and oriented. Well appearing and in no acute distress.  Very pleasant Eyes: Conjunctivae are normal. Head: Atraumatic. Nose: No congestion/rhinnorhea. Mouth/Throat: Mucous membranes are moist. Neck: No stridor.  Cardiovascular: Normal rate, regular rhythm. Grossly normal heart sounds.  Good peripheral circulation. Respiratory: Normal respiratory effort.  No retractions. Lungs CTAB. Gastrointestinal: Soft and moderate reproducible tenderness in the epigastrium and left upper quadrant without rebound or guarding.  Negative  Eulah Pont.  No pain McBurney's point.  No rebound or guarding or evidence to suggest acute abdomen. No distention. Musculoskeletal: No lower extremity tenderness nor edema. Neurologic:  Normal speech and language. No gross focal neurologic deficits are appreciated.  Skin:  Skin is warm, dry and intact. No rash noted. Psychiatric: Mood and affect are normal. Speech and behavior are normal.  ____________________________________________   LABS (all labs ordered are listed, but only abnormal  results are displayed)  Labs Reviewed  BASIC METABOLIC PANEL - Abnormal; Notable for the following components:      Result Value   Glucose, Bld 142 (*)    All other components within normal limits  CBC - Abnormal; Notable for the following components:   WBC 13.9 (*)    All other components within normal limits  HEPATIC FUNCTION PANEL - Abnormal; Notable for the following components:   AST 44 (*)    ALT 47 (*)    All other components within normal limits  RESP PANEL BY RT-PCR (FLU A&B, COVID) ARPGX2  LIPASE, BLOOD  HIV ANTIBODY (ROUTINE TESTING W REFLEX)  TROPONIN I (HIGH SENSITIVITY)  TROPONIN I (HIGH SENSITIVITY)   ____________________________________________  EKG    ED ECG REPORT I, Sharyn Creamer, the attending physician, personally viewed and interpreted this ECG.  Date: 09/15/2020 EKG Time: 1040 Rate: 72 Rhythm: normal sinus rhythm QRS Axis: normal Intervals: normal ST/T Wave abnormalities: normal Narrative Interpretation: no evidence of acute ischemia  ____________________________________________  RADIOLOGY  DG Chest 2 View  Result Date: 09/15/2020 CLINICAL DATA:  Acute pain and shortness of breath. EXAM: CHEST - 2 VIEW COMPARISON:  None. FINDINGS: The cardiomediastinal silhouette is unremarkable. There is no evidence of focal airspace disease, pulmonary edema, suspicious pulmonary nodule/mass, pleural effusion, or pneumothorax. No acute bony abnormalities are identified. IMPRESSION: No active cardiopulmonary disease. Electronically Signed   By: Harmon Pier M.D.   On: 09/15/2020 11:24   CT ABDOMEN PELVIS W CONTRAST  Result Date: 09/15/2020 CLINICAL DATA:  Epigastric pain. EXAM: CT ABDOMEN AND PELVIS WITH CONTRAST TECHNIQUE: Multidetector CT imaging of the abdomen and pelvis was performed using the standard protocol following bolus administration of intravenous contrast. CONTRAST:  53mL OMNIPAQUE IOHEXOL 350 MG/ML SOLN COMPARISON:  None. FINDINGS: Lower chest: No acute  abnormality. Hepatobiliary: There is focal fatty infiltration adjacent to the falciform ligament. No suspicious liver lesion. A large partially calcified gallstone is identified within the gallbladder neck region and measures 3.1 x 2.3 centimeters. No pericholecystic fluid or stranding. There is no biliary duct dilatation. Pancreas: Unremarkable. No pancreatic ductal dilatation or surrounding inflammatory changes. Spleen: Normal in size without focal abnormality. Adrenals/Urinary Tract: Adrenal glands are normal. Kidneys are symmetric in size and enhancement. No focal renal lesion. No hydronephrosis. Ureters are unremarkable. The bladder and visualized portion of the urethra are normal. Stomach/Bowel: The stomach and small bowel loops are normal in appearance. The appendix is well seen and has a normal appearance. Loops of colon are unremarkable. Moderate stool burden within the ascending colon. Vascular/Lymphatic: No significant vascular findings are present. No enlarged abdominal or pelvic lymph nodes. Reproductive: Prostate is unremarkable. Other: No ascites.  Abdominal wall is unremarkable. Musculoskeletal: No acute or significant osseous findings. IMPRESSION: 1. 3.1 centimeter gallstone within the gallbladder neck. Consider further evaluation with abdominal ultrasound. There is no other CT evidence for acute cholecystitis. 2. Normal appendix. 3. Moderate stool burden ascending colon. 4. No acute urinary tract abnormality. Electronically Signed   By: Norva Pavlov M.D.   On: 09/15/2020  15:02   US ABDOMEN LIMITED RUQ (LIVER/GB)  Result Date: 09/15/2020 CLINICAL DATA:  Abdominal pain for 1 day.  Nausea, vomiting. EXAM: ULTRASOUND ABDOMEN LIMITED RIGHT UPPER QUADRANT COMPARISON:  CT of the abdomen and pelvis 09/15/2020 FINDINGS: Gallbladder: Gallbladder wall is normal in thickness, 2.4 millimeters. A 2.3 centimeter stone is identified in the region of the gallbladder neck. No sonographic Murphy sign. There is  a small amount of sludge layering in the gallbladder. Common bile duct: Diameter: 5.0 millimeters Liver: Mildly heterogeneous echotexture of the liver. There are focal areas of hyperechoic parenchyma, consistent with areas of focal fatty infiltration. No suspicious liver lesion identified. Portal vein is patent on color Doppler imaging with normal direction of blood flow towards the liver. Other: None. IMPRESSION: 1. Calcified gallstone within the gallbladder neck. 2. No other evidence for acute cholecystitis. 3. Focal areas of fatty infiltration in the liver. Electronically Signed   By: Norva Pavlov M.D.   On: 09/15/2020 15:28     Imaging reviewed CT and right upper quadrant ultrasound most consistent with gallstone noted in the gallbladder neck.  Appendix is normal. ____________________________________________   PROCEDURES  Procedure(s) performed: None  Procedures  Critical Care performed: No  ____________________________________________   INITIAL IMPRESSION / ASSESSMENT AND PLAN / ED COURSE  Pertinent labs & imaging results that were available during my care of the patient were reviewed by me and considered in my medical decision making (see chart for details).   Differential diagnosis includes but is not limited to, abdominal perforation, aortic dissection, cholecystitis, appendicitis, diverticulitis, colitis, esophagitis/gastritis, kidney stone, pyelonephritis, urinary tract infection, aortic aneurysm. All are considered in decision and treatment plan. Based upon the patient's presentation and risk factors, given the location discomfort and will obtain further evaluation including LFTs, lipase, CT imaging.  Hydrate pain control  Patient does not use alcohol.  Pain is located epigastric left upper quadrant.  Negative Murphy.  Wife currently being treated for E. coli infection, however patient not febrile, no diarrhea, no having active vomiting does not appear to have obvious symptoms  of colitis   ----------------------------------------- 3:54 PM on 09/15/2020 ----------------------------------------- Consult placed with Dr. Maia Plan.  Symptomatic biliary colic, thus far unrelieved after 8 mg of morphine.  On exam patient remains tender in the epigastrium reports ongoing pain and discomfort was somewhat better but seems to be rebounding.  Patient understanding of plan for surgical consult   Clinical Course as of 09/15/20 1640  Sun Sep 15, 2020  1633 Cintron at bedside [MQ]    Clinical Course User Index [MQ] Sharyn Creamer, MD    Patient admitted to general surgery service.  Stable for admission ____________________________________________   FINAL CLINICAL IMPRESSION(S) / ED DIAGNOSES  Final diagnoses:  Abdominal pain  Gallstones  Biliary colic        Note:  This document was prepared using Dragon voice recognition software and may include unintentional dictation errors       Sharyn Creamer, MD 09/15/20 1640

## 2020-09-15 NOTE — Op Note (Signed)
Preoperative diagnosis: Acute cholecystitis  Postoperative diagnosis: Acute cholecystitis  Procedure: Robotic Assisted Laparoscopic Cholecystectomy.   Anesthesia: GETA   Surgeon: Dr. Hazle Quant  Wound Classification: Clean Contaminated  Indications: Patient is a 45 y.o. male developed right upper quadrant pain and on workup was found to have a 3 cm cholelithiasis started in the large neck with a normal common duct.  Pain not improved with morphine.  Robotic Assisted Laparoscopic cholecystectomy was elected.  Findings: Extremely large and inflamed about her   Critical view of safety achieved   Cystic duct and artery identified, ligated and divided Adequate hemostasis  Description of procedure: The patient was placed on the operating table in the supine position. General anesthesia was induced. A time-out was completed verifying correct patient, procedure, site, positioning, and implant(s) and/or special equipment prior to beginning this procedure. An orogastric tube was placed. The abdomen was prepped and draped in the usual sterile fashion.  An incision was made in a natural skin line below the umbilicus.  The fascia was elevated and the Veress needle inserted. Proper position was confirmed by aspiration and saline meniscus test.  The abdomen was insufflated with carbon dioxide to a pressure of 15 mmHg. The patient tolerated insufflation well. A 8-mm trocar was then inserted in optiview fashion.  The laparoscope was inserted and the abdomen inspected. No injuries from initial trocar placement were noted. Additional trocars were then inserted in the following locations: an 8-mm trocar in the left lateral abdomen, and another two 8-mm trocars to the right side of the abdomen 5 cm appart. The umbilical trocar was changed to a 12 mm trocar all under direct visualization. The abdomen was inspected and no abnormalities were found. The table was placed in the reverse Trendelenburg position  with the right side up. The robotic arms were docked and target anatomy identified. Instrument inserted under direct visualization.  Filmy adhesions between the gallbladder and omentum, duodenum and transverse colon were lysed with electrocautery. The dome of the gallbladder was grasped with a prograsp and retracted over the dome of the liver.  Since the infundibulum and cystic structure, unable to be identify due to abundant amount of omental fat and additional 5 mm trocar was needed to be added in the right upper quadrant for retraction.  The 5 mm trocar was inserted under direct visualization.  A peer retractor was used to retract the omental fat.  With this maneuver I was able to visualize the infundibulum and cystic structures.  The infundibulum was also grasped with an atraumatic grasper and retracted toward the right lower quadrant. This maneuver exposed Calot's triangle.  The very difficult and time-consuming dissection on the infundibular area was needed to be done to be able to identify the cystic duct and cystic artery identified and circumferentially dissected. Critical view of safety reviewed before ligating any structure. Firefly images taken to visualize biliary ducts. The cystic duct and cystic artery were then doubly clipped and divided close to the gallbladder.  The gallbladder was then dissected from its peritoneal attachments by electrocautery. Hemostasis was checked and the gallbladder and contained stones were removed using an endoscopic retrieval bag. The gallbladder was passed off the table as a specimen. There was no evidence of bleeding from the gallbladder fossa or cystic artery or leakage of the bile from the cystic duct stump. Secondary trocars were removed under direct vision. No bleeding was noted. The robotic arms were undoked. The scope was withdrawn and the umbilical trocar removed. The abdomen was  allowed to collapse. The fascia of the 60mm trocar sites was closed with  figure-of-eight 0 vicryl sutures. The skin was closed with subcuticular sutures of 4-0 monocryl and topical skin adhesive. The orogastric tube was removed.  The patient tolerated the procedure well and was taken to the postanesthesia care unit in stable condition.   Specimen: Gallbladder  Complications: None  EBL: 10 mL

## 2020-09-15 NOTE — ED Notes (Signed)
Pt transported to US with tech.

## 2020-09-15 NOTE — ED Notes (Signed)
Report to brandy, OR RN

## 2020-09-15 NOTE — Anesthesia Preprocedure Evaluation (Addendum)
Anesthesia Evaluation  Patient identified by MRN, date of birth, ID band Patient awake    Reviewed: Allergy & Precautions, NPO status , Patient's Chart, lab work & pertinent test results  Airway Mallampati: III  TM Distance: <3 FB Neck ROM: Full    Dental no notable dental hx.    Pulmonary neg pulmonary ROS,    Pulmonary exam normal        Cardiovascular Exercise Tolerance: Good negative cardio ROS Normal cardiovascular exam     Neuro/Psych negative neurological ROS  negative psych ROS   GI/Hepatic Neg liver ROS, neg GERD (some GERD and nausea associated with this pain)  ,  Endo/Other    Renal/GU negative Renal ROS  negative genitourinary   Musculoskeletal negative musculoskeletal ROS (+)   Abdominal   Peds negative pediatric ROS (+)  Hematology negative hematology ROS (+)   Anesthesia Other Findings IMPRESSION: 1. Calcified gallstone within the gallbladder neck. 2. No other evidence for acute cholecystitis. 3. Focal areas of fatty infiltration in the liver.  Results for John Galloway, John Galloway (MRN [id number]) as of 09/15/2020 17:13  09/15/2020 10:42 WBC: 13.9 (H) RBC: 5.27 Hemoglobin: 16.1 HCT: 44.9 MCV: 85.2 MCH: 30.6 MCHC: 35.9 RDW: 12.9 Platelets: 269 nRBC: 0.0 Glucose: 142 (H)   Reproductive/Obstetrics negative OB ROS                          Anesthesia Physical Anesthesia Plan  ASA: 2  Anesthesia Plan: General   Post-op Pain Management:    Induction:   PONV Risk Score and Plan: 2 and Midazolam and Ondansetron  Airway Management Planned: Oral ETT  Additional Equipment:   Intra-op Plan:   Post-operative Plan:   Informed Consent:   Plan Discussed with: CRNA  Anesthesia Plan Comments:         Anesthesia Quick Evaluation

## 2020-09-15 NOTE — Anesthesia Postprocedure Evaluation (Signed)
Anesthesia Post Note  Patient: John Galloway  Procedure(s) Performed: XI ROBOTIC ASSISTED LAPAROSCOPIC CHOLECYSTECTOMY INDOCYANINE GREEN FLUORESCENCE IMAGING (ICG)  Patient location during evaluation: PACU Anesthesia Type: General Level of consciousness: awake and alert Pain management: pain level controlled Vital Signs Assessment: post-procedure vital signs reviewed and stable Respiratory status: spontaneous breathing, nonlabored ventilation, respiratory function stable and patient connected to nasal cannula oxygen Cardiovascular status: blood pressure returned to baseline and stable Postop Assessment: no apparent nausea or vomiting Anesthetic complications: no   No notable events documented.   Last Vitals:  Vitals:   09/15/20 2200 09/15/20 2203  BP: 137/88   Pulse: 88 86  Resp: 10 10  Temp: 37.1 C   SpO2: 96% 93%    Last Pain:  Vitals:   09/15/20 2203  TempSrc:   PainSc: 1                  Mattie Nordell

## 2020-09-16 MED ORDER — HYDROCODONE-ACETAMINOPHEN 5-325 MG PO TABS: 1.0000 | ORAL_TABLET | ORAL | 0 refills | Status: AC | PRN

## 2020-09-16 NOTE — Discharge Instructions (Signed)

## 2020-09-16 NOTE — Discharge Summary (Signed)
   Patient ID: John Galloway MRN: [id number] DOB/AGE: [birth year] 47 y.o.  Admit date: 09/15/2020 Discharge date: 09/16/2020   Discharge Diagnoses:  Active Problems:   Acute cholecystitis   Procedures: Robotic assisted laparoscopic cholecystectomy  Hospital Course: Patient with acute cholecystitis.  He underwent robotic-assisted laparoscopic cholecystectomy.  Today the patient with pain control.  Tolerating diet.  Ambulating.  Wounds are dry and clean.  Physical Exam Cardiovascular:     Rate and Rhythm: Normal rate.  Pulmonary:     Effort: Pulmonary effort is normal.  Abdominal:     General: Abdomen is flat. Bowel sounds are normal.     Palpations: Abdomen is soft.  Neurological:     General: No focal deficit present.     Mental Status: He is alert and oriented to person, place, and time.     Consults: None  Disposition: Discharge disposition: 01-Home or Self Care       Discharge Instructions     Diet - low sodium heart healthy   Complete by: As directed    Increase activity slowly   Complete by: As directed       Allergies as of 09/16/2020   No Known Allergies      Medication List     STOP taking these medications    clotrimazole-betamethasone cream Commonly known as: LOTRISONE       TAKE these medications    HYDROcodone-acetaminophen 5-325 MG tablet Commonly known as: Norco Take 1 tablet by mouth every 4 (four) hours as needed for up to 3 days for moderate pain.

## 2020-09-18 LAB — SURGICAL PATHOLOGY

## 2022-01-15 IMAGING — CT CT ABD-PELV W/ CM
2 of 5 series · 16 of 46 positions shown, 18 images · IV contrast (APPLIED)
Comparison: None.

CLINICAL DATA: Epigastric pain.

EXAM:
CT ABDOMEN AND PELVIS WITH CONTRAST
TECHNIQUE: Multidetector CT imaging of the abdomen and pelvis was performed
using the standard protocol following bolus administration of
intravenous contrast.
CONTRAST:  75mL OMNIPAQUE IOHEXOL 350 MG/ML SOLN

[Series 2: routine abd/pel with · axial · 0.82mm/px · z∈[-1017,-507]mm · 13 of 114 slices shown, 15 images]
[im 6/114  soft-tissue]
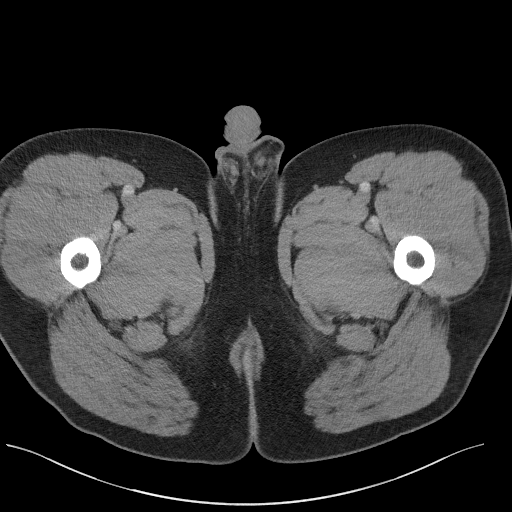
[im 6/114  bone]
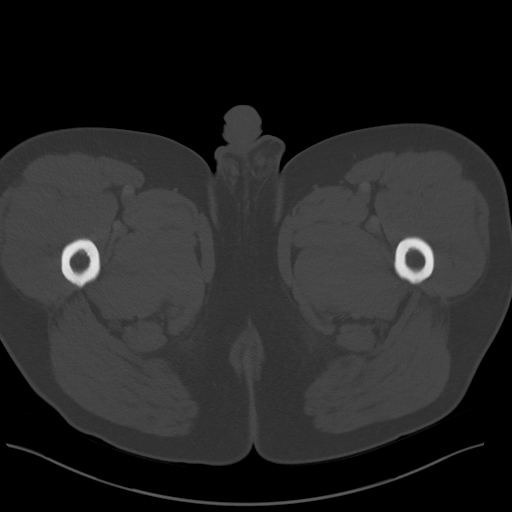
[im 18/114  soft-tissue]
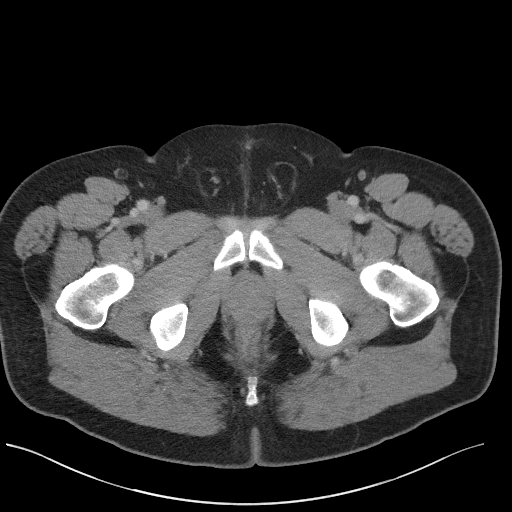
[im 24/114  soft-tissue]
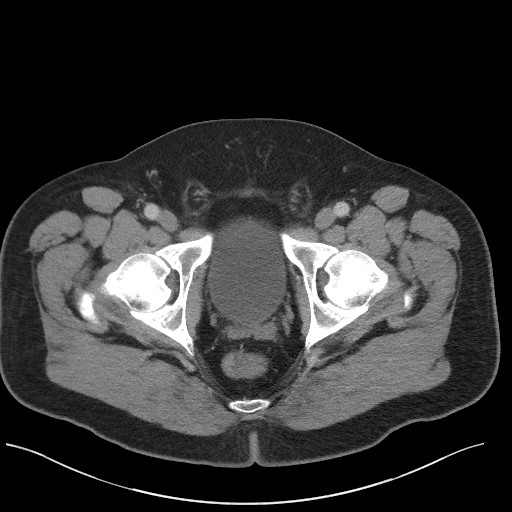
[im 30/114  soft-tissue]
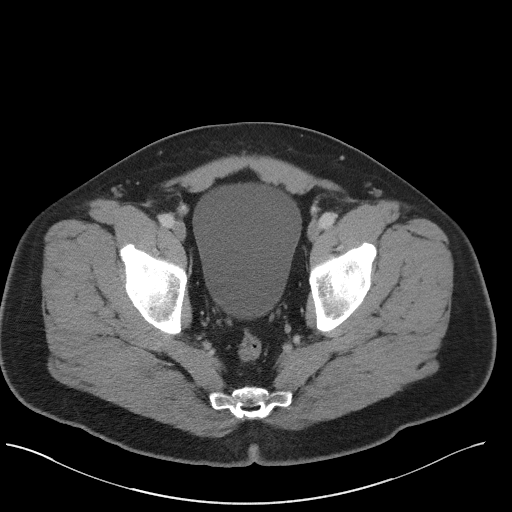
[im 42/114  soft-tissue]
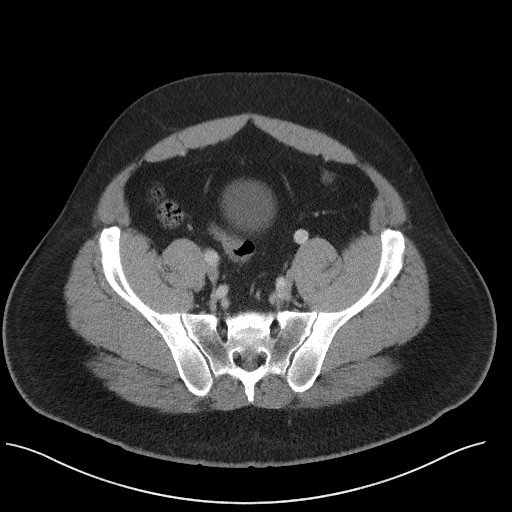
[im 48/114  soft-tissue]
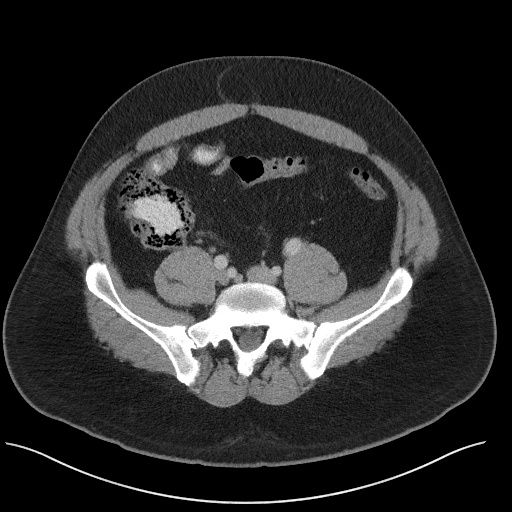
[im 60/114  soft-tissue]
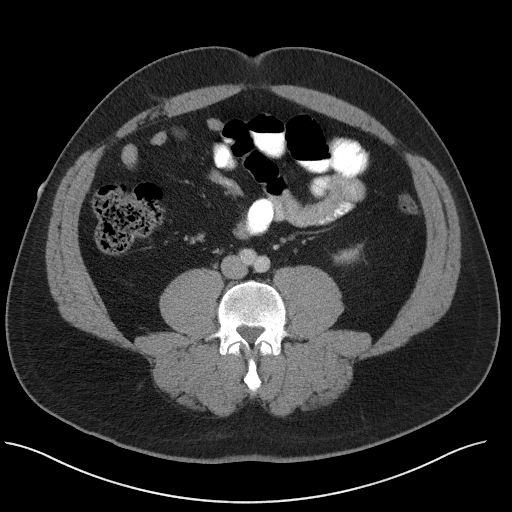
[im 66/114  soft-tissue]
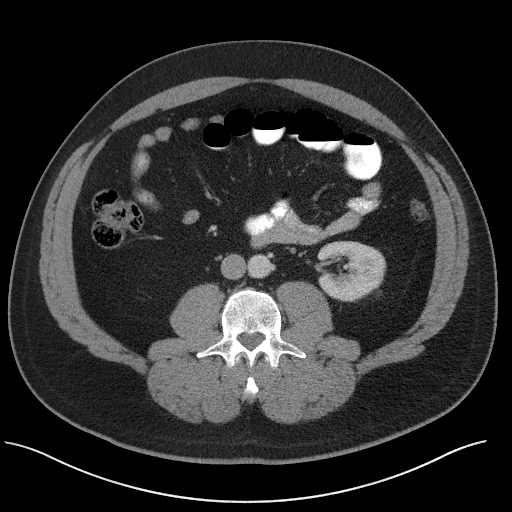
[im 72/114  soft-tissue]
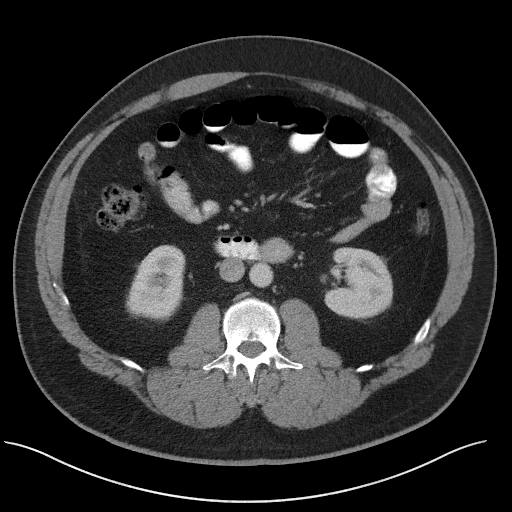
[im 72/114  bone]
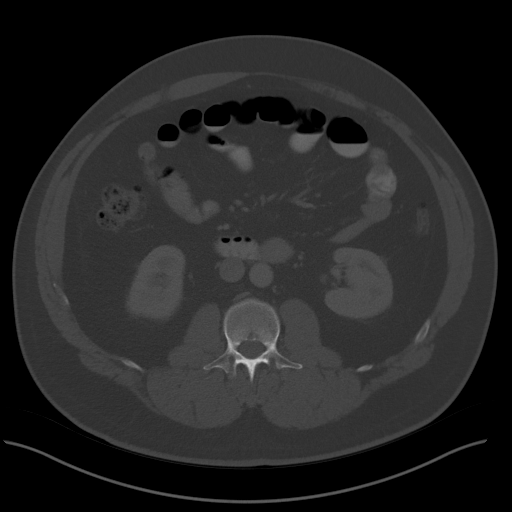
[im 84/114  soft-tissue]
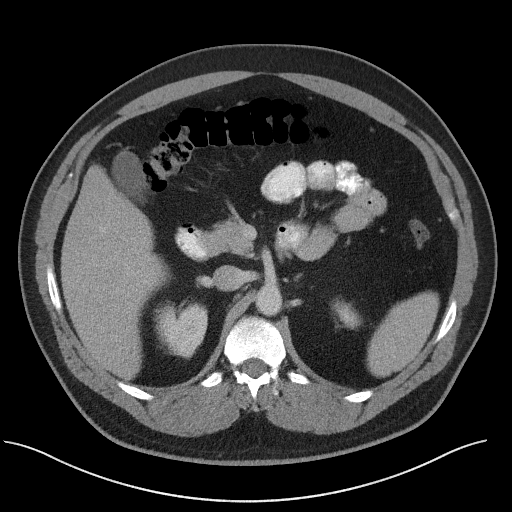
[im 90/114  soft-tissue]
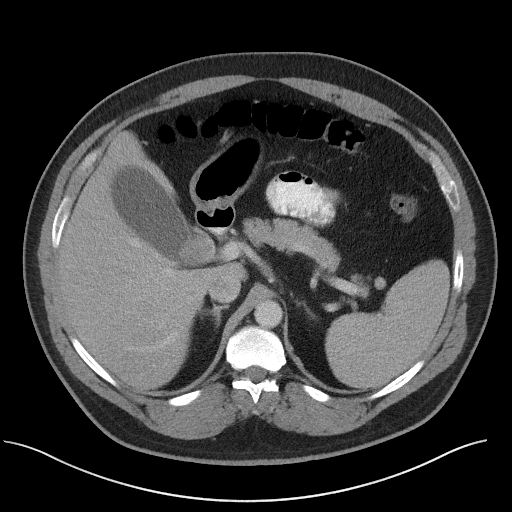
[im 96/114  soft-tissue]
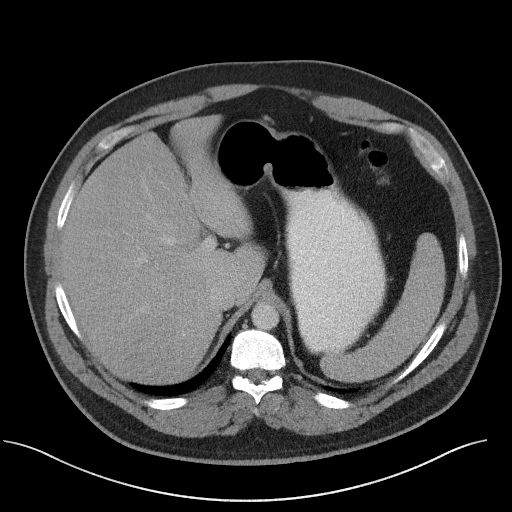
[im 108/114  soft-tissue]
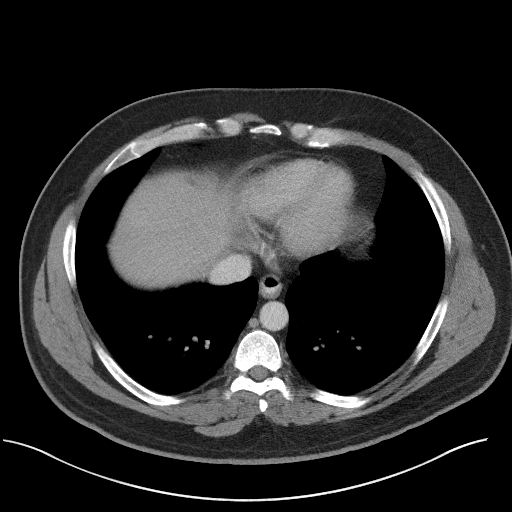

[Series 5: coronal st · coronal · 0.90mm/px · 3 of 119 slices shown]
[im 40/119  soft-tissue]
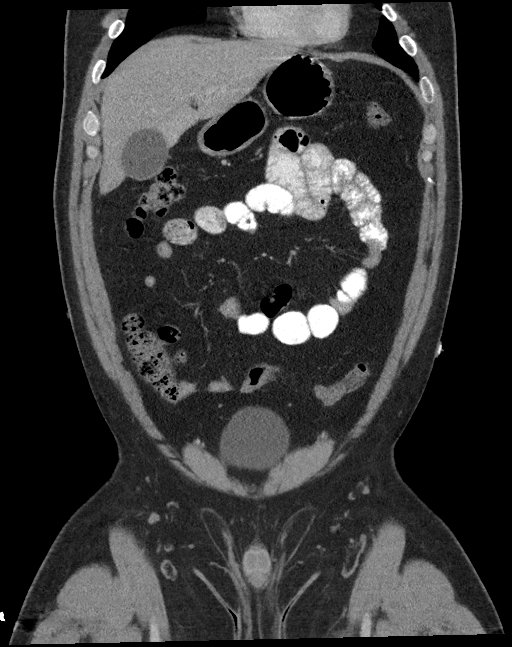
[im 53/119  soft-tissue]
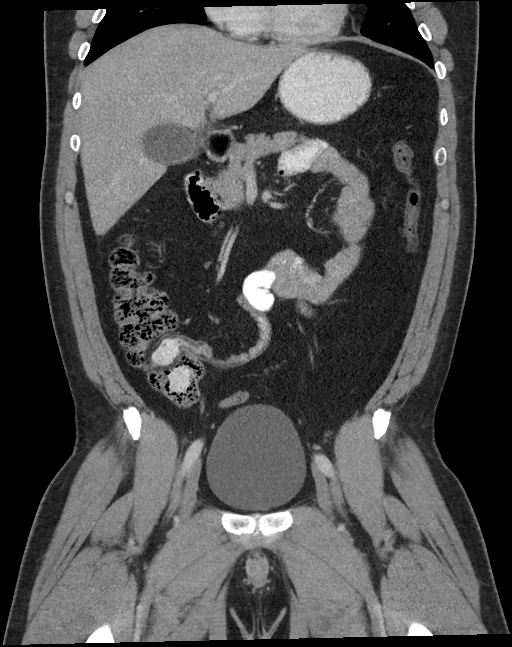
[im 66/119  soft-tissue]
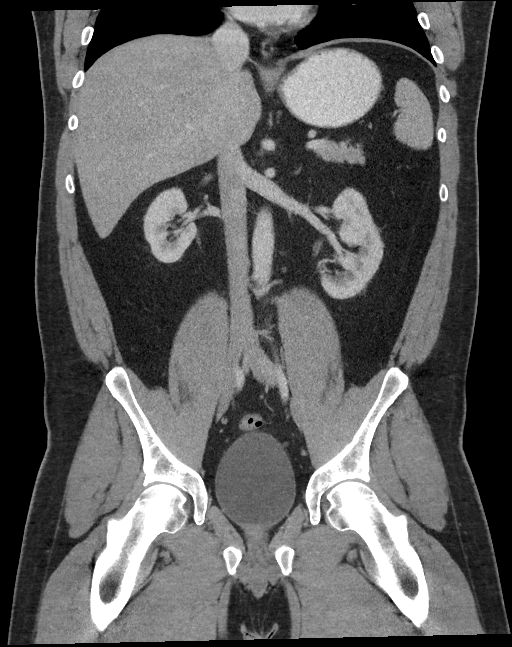

[16 of 46 positions shown; findings below may reference images not displayed]

FINDINGS: Lower chest: No acute abnormality.

Hepatobiliary: There is focal fatty infiltration adjacent to the
falciform ligament. No suspicious liver lesion. A large partially
calcified gallstone is identified within the gallbladder neck region
and measures 3.1 x 2.3 centimeters. No pericholecystic fluid or
stranding. There is no biliary duct dilatation.

Pancreas: Unremarkable. No pancreatic ductal dilatation or
surrounding inflammatory changes.

Spleen: Normal in size without focal abnormality.

Adrenals/Urinary Tract: Adrenal glands are normal. Kidneys are
symmetric in size and enhancement. No focal renal lesion. No
hydronephrosis. Ureters are unremarkable. The bladder and visualized
portion of the urethra are normal.

Stomach/Bowel: The stomach and small bowel loops are normal in
appearance. The appendix is well seen and has a normal appearance.
Loops of colon are unremarkable. Moderate stool burden within the
ascending colon.

Vascular/Lymphatic: No significant vascular findings are present. No
enlarged abdominal or pelvic lymph nodes.

Reproductive: Prostate is unremarkable.

Other: No ascites.  Abdominal wall is unremarkable.

Musculoskeletal: No acute or significant osseous findings.
IMPRESSION: 1. 3.1 centimeter gallstone within the gallbladder neck. Consider
further evaluation with abdominal ultrasound. There is no other CT
evidence for acute cholecystitis.
2. Normal appendix.
3. Moderate stool burden ascending colon.
4. No acute urinary tract abnormality.
